# Patient Record
Sex: Male | Born: 1975 | Race: White | Hispanic: No | Marital: Married | Smoking: Former smoker
Health system: Southern US, Community
[De-identification: ages and names within clinical notes are randomized; demographics above are authoritative.]

## PROBLEM LIST (undated history)

## (undated) DIAGNOSIS — K219 Gastro-esophageal reflux disease without esophagitis: Secondary | ICD-10-CM

## (undated) HISTORY — PX: FUNCTIONAL ENDOSCOPIC SINUS SURGERY: SUR616

---

## 1999-02-02 ENCOUNTER — Emergency Department (HOSPITAL_COMMUNITY): Admission: EM | Admit: 1999-02-02 | Discharge: 1999-02-02 | Payer: Self-pay | Admitting: *Deleted

## 2006-08-01 ENCOUNTER — Ambulatory Visit: Payer: Self-pay | Admitting: Anesthesiology

## 2013-02-11 ENCOUNTER — Ambulatory Visit: Payer: Self-pay | Admitting: Medical

## 2015-01-04 ENCOUNTER — Encounter: Payer: Self-pay | Admitting: *Deleted

## 2015-01-05 ENCOUNTER — Encounter
Admission: RE | Disposition: A | Discharge status: Home Health | Payer: Self-pay | Source: Ambulatory Visit | Attending: Gastroenterology

## 2015-01-05 ENCOUNTER — Ambulatory Visit: Payer: 59 | Admitting: Anesthesiology

## 2015-01-05 ENCOUNTER — Encounter: Payer: Self-pay | Admitting: *Deleted

## 2015-01-05 ENCOUNTER — Ambulatory Visit
Admission: RE | Admit: 2015-01-05 | Discharge: 2015-01-05 | Disposition: A | Discharge status: Home Health | Payer: 59 | Source: Ambulatory Visit | Attending: Gastroenterology | Admitting: Gastroenterology

## 2015-01-05 DIAGNOSIS — Z833 Family history of diabetes mellitus: Secondary | ICD-10-CM | POA: Diagnosis not present

## 2015-01-05 DIAGNOSIS — Z8249 Family history of ischemic heart disease and other diseases of the circulatory system: Secondary | ICD-10-CM | POA: Diagnosis not present

## 2015-01-05 DIAGNOSIS — Z801 Family history of malignant neoplasm of trachea, bronchus and lung: Secondary | ICD-10-CM | POA: Insufficient documentation

## 2015-01-05 DIAGNOSIS — Z87891 Personal history of nicotine dependence: Secondary | ICD-10-CM | POA: Diagnosis not present

## 2015-01-05 DIAGNOSIS — K219 Gastro-esophageal reflux disease without esophagitis: Secondary | ICD-10-CM | POA: Insufficient documentation

## 2015-01-05 DIAGNOSIS — K625 Hemorrhage of anus and rectum: Secondary | ICD-10-CM | POA: Insufficient documentation

## 2015-01-05 DIAGNOSIS — Z8719 Personal history of other diseases of the digestive system: Secondary | ICD-10-CM | POA: Diagnosis present

## 2015-01-05 DIAGNOSIS — Z79899 Other long term (current) drug therapy: Secondary | ICD-10-CM | POA: Diagnosis not present

## 2015-01-05 DIAGNOSIS — Z9889 Other specified postprocedural states: Secondary | ICD-10-CM | POA: Insufficient documentation

## 2015-01-05 HISTORY — DX: Gastro-esophageal reflux disease without esophagitis: K21.9

## 2015-01-05 HISTORY — PX: COLONOSCOPY WITH PROPOFOL: SHX5780

## 2015-01-05 SURGERY — COLONOSCOPY WITH PROPOFOL
Anesthesia: General

## 2015-01-05 MED ORDER — SODIUM CHLORIDE 0.9 % IV SOLN: INTRAVENOUS | Status: DC

## 2015-01-05 MED ORDER — MIDAZOLAM HCL 2 MG/2ML IJ SOLN
INTRAMUSCULAR | Status: DC | PRN
  Administered 2015-01-05: 2 mg via INTRAVENOUS

## 2015-01-05 MED ORDER — SODIUM CHLORIDE 0.9 % IV SOLN
INTRAVENOUS | Status: DC
  Administered 2015-01-05: 11:00:00 via INTRAVENOUS

## 2015-01-05 MED ORDER — LIDOCAINE HCL (CARDIAC) 20 MG/ML IV SOLN
INTRAVENOUS | Status: DC | PRN
  Administered 2015-01-05: 60 mg via INTRAVENOUS

## 2015-01-05 MED ORDER — PROPOFOL 500 MG/50ML IV EMUL
INTRAVENOUS | Status: DC | PRN
  Administered 2015-01-05: 140 ug/kg/min via INTRAVENOUS

## 2015-01-05 MED ORDER — PROPOFOL 10 MG/ML IV BOLUS
INTRAVENOUS | Status: DC | PRN
  Administered 2015-01-05: 50 mg via INTRAVENOUS

## 2015-01-05 NOTE — H&P (Signed)
    Primary Care Physician:  Erasmo Downer, NP Primary Gastroenterologist:  Dr. Bluford Kaufmann  Pre-Procedure History & Physical: HPI:  Jonathan Arias is a 39 y.o. male is here for an colonoscopy.   Past Medical History  Diagnosis Date  . GERD (gastroesophageal reflux disease)     Past Surgical History  Procedure Laterality Date  . Functional endoscopic sinus surgery      Prior to Admission medications   Medication Sig Start Date End Date Taking? Authorizing Provider  omeprazole (PRILOSEC) 20 MG capsule Take 20 mg by mouth daily.   Yes Historical Provider, MD    Allergies as of 12/20/2014  . (Not on File)    History reviewed. No pertinent family history.  Social History   Social History  . Marital Status: Married    Spouse Name: N/A  . Number of Children: N/A  . Years of Education: N/A   Occupational History  . Not on file.   Social History Main Topics  . Smoking status: Former Games developer  . Smokeless tobacco: Never Used  . Alcohol Use: No  . Drug Use: No  . Sexual Activity: Not on file   Other Topics Concern  . Not on file   Social History Narrative    Review of Systems: See HPI, otherwise negative ROS  Physical Exam: BP 132/68 mmHg  Pulse 52  Temp(Src) 98.3 F (36.8 C) (Tympanic)  Resp 14  Ht  (1.88 m)  Wt 96.163 kg (212 lb)  BMI 27.21 kg/m2  SpO2 100% General:   Alert,  pleasant and cooperative in NAD Head:  Normocephalic and atraumatic. Neck:  Supple; no masses or thyromegaly. Lungs:  Clear throughout to auscultation.    Heart:  Regular rate and rhythm. Abdomen:  Soft, nontender and nondistended. Normal bowel sounds, without guarding, and without rebound.   Neurologic:  Alert and  oriented x4;  grossly normal neurologically.  Impression/Plan: Jonathan Arias is here for an colonoscopy to be performed for rectal bleeding.  Risks, benefits, limitations, and alternatives regarding colonoscopy have been reviewed with the patient.  Questions  have been answered.  All parties agreeable.   OH, Ezzard Standing, MD  01/05/2015, 10:32 AM

## 2015-01-05 NOTE — Transfer of Care (Signed)
Immediate Anesthesia Transfer of Care Note  Patient: Jonathan Arias  Procedure(s) Performed: Procedure(s): COLONOSCOPY WITH PROPOFOL (N/A)  Patient Location: Endoscopy Unit  Anesthesia Type:General  Level of Consciousness: awake, alert , oriented and patient cooperative  Airway & Oxygen Therapy: Patient Spontanous Breathing and Patient connected to nasal cannula oxygen  Post-op Assessment: Report given to RN, Post -op Vital signs reviewed and stable and Patient moving all extremities X 4  Post vital signs: Reviewed and stable  Last Vitals:  Filed Vitals:   01/05/15 1136  BP: 100/62  Pulse: 64  Temp: 35.8 C  Resp: 20    Complications: No apparent anesthesia complications

## 2015-01-05 NOTE — Op Note (Signed)
[facility] Patient Name: Jonathan Arias Procedure Date: 01/05/2015 11:16 AM MRN: [id number] Account #: [id number] Date of Birth: [birth year] Admit Type: Outpatient Age: 39 Room: [facility] ENDO ROOM 4 Gender: Male Note Status: Finalized Procedure:         Colonoscopy Indications:       Rectal bleeding Providers:         Ezzard Standing. Bluford Kaufmann, MD Referring MD:      Sallye Lat Md, MD (Referring MD) Medicines:         Monitored Anesthesia Care Complications:     No immediate complications. Procedure:         Pre-Anesthesia Assessment:                    - Prior to the procedure, a History and Physical was                     performed, and patient medications, allergies and                     sensitivities were reviewed. The patient's tolerance of                     previous anesthesia was reviewed.                    - The risks and benefits of the procedure and the sedation                     options and risks were discussed with the patient. All                     questions were answered and informed consent was obtained.                    - After reviewing the risks and benefits, the patient was                     deemed in satisfactory condition to undergo the procedure.                    After obtaining informed consent, the colonoscope was                     passed under direct vision. Throughout the procedure, the                     patient's blood pressure, pulse, and oxygen saturations                     were monitored continuously. The Olympus CF-Q160AL                     colonoscope (S#. [id number]) was introduced through the anus                     and advanced to the the cecum, identified by appendiceal                     orifice and ileocecal valve. The colonoscopy was performed                     without difficulty. The patient tolerated the procedure  well. The quality of the bowel preparation was good. Findings:  The colon (entire examined portion) appeared normal. Impression:        - The entire examined colon is normal.                    - No specimens collected. Recommendation:    - Discharge patient to home.                    - Repeat colonoscopy in 10 years for surveillance.                    - The findings and recommendations were discussed with the                     patient. Procedure Code(s): --- Professional ---                    [phone], Colonoscopy, flexible; diagnostic, including                     collection of specimen(s) by brushing or washing, when                     performed (separate procedure) Diagnosis Code(s): --- Professional ---                    K62.5, Hemorrhage of anus and rectum CPT copyright 2014 American Medical Association. All rights reserved. The codes documented in this report are preliminary and upon coder review may  be revised to meet current compliance requirements. Wallace Cullens, MD 01/05/2015 11:31:01 AM This report has been signed electronically. Number of Addenda: 0 Note Initiated On: 01/05/2015 11:16 AM Scope Withdrawal Time: 0 hours 5 minutes 2 seconds  Total Procedure Duration: 0 hours 7 minutes 46 seconds       [facility]

## 2015-01-05 NOTE — Anesthesia Preprocedure Evaluation (Signed)
Anesthesia Evaluation  Patient identified by MRN, date of birth, ID band Patient awake    Reviewed: Allergy & Precautions, H&P , NPO status , Patient's Chart, lab work & pertinent test results  History of Anesthesia Complications Negative for: history of anesthetic complications  Airway Mallampati: II  TM Distance: >3 FB Neck ROM: full    Dental no notable dental hx. (+) Poor Dentition, Missing, Upper Dentures   Pulmonary neg shortness of breath, COPD, former smoker,    Pulmonary exam normal breath sounds clear to auscultation       Cardiovascular Exercise Tolerance: Good (-) Past MI negative cardio ROS Normal cardiovascular exam Rhythm:regular Rate:Normal     Neuro/Psych negative neurological ROS  negative psych ROS   GI/Hepatic negative GI ROS, Neg liver ROS, GERD  Controlled,  Endo/Other  negative endocrine ROS  Renal/GU negative Renal ROS  negative genitourinary   Musculoskeletal   Abdominal   Peds  Hematology negative hematology ROS (+)   Anesthesia Other Findings Past Medical History:   GERD (gastroesophageal reflux disease)                       Reproductive/Obstetrics negative OB ROS                             Anesthesia Physical Anesthesia Plan  ASA: III  Anesthesia Plan: General   Post-op Pain Management:    Induction:   Airway Management Planned:   Additional Equipment:   Intra-op Plan:   Post-operative Plan:   Informed Consent: I have reviewed the patients History and Physical, chart, labs and discussed the procedure including the risks, benefits and alternatives for the proposed anesthesia with the patient or authorized representative who has indicated his/her understanding and acceptance.   Dental Advisory Given  Plan Discussed with: Anesthesiologist, CRNA and Surgeon  Anesthesia Plan Comments:         Anesthesia Quick Evaluation

## 2015-01-06 NOTE — Anesthesia Postprocedure Evaluation (Signed)
  Anesthesia Post-op Note  Patient: Jonathan Arias  Procedure(s) Performed: Procedure(s): COLONOSCOPY WITH PROPOFOL (N/A)  Anesthesia type:General  Patient location: PACU  Post pain: Pain level controlled  Post assessment: Post-op Vital signs reviewed, Patient's Cardiovascular Status Stable, Respiratory Function Stable, Patent Airway and No signs of Nausea or vomiting  Post vital signs: Reviewed and stable  Last Vitals:  Filed Vitals:   01/05/15 1207  BP: 108/80  Pulse: 52  Temp:   Resp: 18    Level of consciousness: awake, alert  and patient cooperative  Complications: No apparent anesthesia complications

## 2015-01-26 ENCOUNTER — Encounter: Payer: Self-pay | Admitting: Gastroenterology

## 2020-06-18 ENCOUNTER — Other Ambulatory Visit: Payer: Self-pay

## 2020-06-18 ENCOUNTER — Ambulatory Visit
Admission: EM | Admit: 2020-06-18 | Discharge: 2020-06-18 | Disposition: A | Discharge status: Home / Self Care | Payer: 59 | Attending: Physician Assistant | Admitting: Physician Assistant

## 2020-06-18 ENCOUNTER — Encounter: Payer: Self-pay | Admitting: Emergency Medicine

## 2020-06-18 ENCOUNTER — Ambulatory Visit (INDEPENDENT_AMBULATORY_CARE_PROVIDER_SITE_OTHER): Payer: 59

## 2020-06-18 DIAGNOSIS — S20212A Contusion of left front wall of thorax, initial encounter: Secondary | ICD-10-CM

## 2020-06-18 DIAGNOSIS — R0781 Pleurodynia: Secondary | ICD-10-CM

## 2020-06-18 DIAGNOSIS — W11XXXA Fall on and from ladder, initial encounter: Secondary | ICD-10-CM

## 2020-06-18 NOTE — ED Triage Notes (Signed)
Pt states he fell today off a ladder today about 3:30. He state she was on the first step and his feet got tangled and he landed on his right arm against his rib cage. He is having rib pain and left shoulder pain. He states the ribs are the worst. Declines shoulder xray.

## 2020-06-18 NOTE — Discharge Instructions (Signed)
RIB PAIN: Chest and rib x-rays do not show any fractures. Your ribs are likely bruised and/or sprained. Stressed avoiding painful activities . Reviewed RICE guidelines. Use medications as directed, including NSAIDs. If no NSAIDs have been prescribed for you today, you may take Aleve or Motrin over the counter. May use Tylenol in between doses of NSAIDs.  If no improvement in the next 1-2 weeks, f/u with PCP or return to our office for reexamination, and please feel free to call or return at any time for any questions or concerns you may have and we will be happy to help you!

## 2020-06-18 NOTE — ED Provider Notes (Signed)
[facility]    CSN: [id number] Arrival date & time: 06/18/20  1809      History   Chief Complaint Chief Complaint  Patient presents with  . Fall  . Rib Injury    HPI Jonathan Arias is a 45 y.o. male presenting for pain of the left lateral ribs following a fall from a ladder earlier today.  He says that his feet got twisted up and he fell down and his chest and ribs somehow landed on his own fist.  He says this happened about 5 hours ago.  He has pain whenever he breathes, laughs, touches the area or moves certain ways.  He denies any anterior chest pain or breathing difficulty.  Has not had any hemoptysis.  Denies any abdominal pain and does not notice any blood in the urine.  He says he also did hit his left shoulder but he can move it around just fine it is not really concerned about his shoulder.  He does not take anything for pain relief.  Has not iced the area.  He says he never hit his head or passed out.  He has no other complaints or concerns today.  HPI  Past Medical History:  Diagnosis Date  . GERD (gastroesophageal reflux disease)     There are no problems to display for this patient.   Past Surgical History:  Procedure Laterality Date  . COLONOSCOPY WITH PROPOFOL N/A 01/05/2015   Procedure: COLONOSCOPY WITH PROPOFOL;  Surgeon: Wallace Cullens, MD;  Location: [facility] ENDOSCOPY;  Service: Gastroenterology;  Laterality: N/A;  . FUNCTIONAL ENDOSCOPIC SINUS SURGERY         Home Medications    Prior to Admission medications   Medication Sig Start Date End Date Taking? Authorizing Provider  omeprazole (PRILOSEC) 20 MG capsule Take 20 mg by mouth daily.   Yes [provider]    Family History History reviewed. No pertinent family history.  Social History Social History   Tobacco Use  . Smoking status: Former Games developer  . Smokeless tobacco: Never Used  Vaping Use  . Vaping Use: Never used  Substance Use Topics  . Alcohol use: No  . Drug use: No      Allergies   Patient has no known allergies.   Review of Systems Review of Systems  Constitutional: Negative for fatigue.  Respiratory: Negative for cough and shortness of breath.   Cardiovascular: Negative for chest pain.  Gastrointestinal: Negative for abdominal pain.  Genitourinary: Negative for hematuria.  Musculoskeletal: Positive for arthralgias (rib pain and shoulder pain). Negative for gait problem, joint swelling and myalgias.  Skin: Negative for rash and wound.  Neurological: Negative for syncope, weakness, numbness and headaches.     Physical Exam Triage Vital Signs ED Triage Vitals  Enc Vitals Group     BP 06/18/20 1937 136/85     Pulse Rate 06/18/20 1937 69     Resp 06/18/20 1937 18     Temp 06/18/20 1937 98.1 F (36.7 C)     Temp Source 06/18/20 1937 Oral     SpO2 06/18/20 1937 100 %     Weight 06/18/20 1935 212 lb 1.3 oz (96.2 kg)     Height 06/18/20 1935 6\' 2"  (1.88 m)     Head Circumference --      Peak Flow --      Pain Score 06/18/20 1935 5     Pain Loc --      Pain Edu? --  Excl. in GC? --    No data found.  Updated Vital Signs BP 136/85 (BP Location: Right Arm)   Pulse 69   Temp 98.1 F (36.7 C) (Oral)   Resp 18   Ht 6\' 2"  (1.88 m)   Wt 212 lb 1.3 oz (96.2 kg)   SpO2 100%   BMI 27.23 kg/m       Physical Exam Vitals and nursing note reviewed.  Constitutional:      General: He is not in acute distress.    Appearance: Normal appearance. He is well-developed and normal weight. He is not ill-appearing.  HENT:     Head: Normocephalic and atraumatic.  Eyes:     General: No scleral icterus.    Conjunctiva/sclera: Conjunctivae normal.  Cardiovascular:     Rate and Rhythm: Normal rate and regular rhythm.     Heart sounds: Normal heart sounds.  Pulmonary:     Effort: Pulmonary effort is normal. No respiratory distress.     Breath sounds: Normal breath sounds. No wheezing, rhonchi or rales.  Chest:     Chest wall: Tenderness  (TTP lateral ribs 5-7. No bruising or swelling. No step offs) present.  Abdominal:     Palpations: Abdomen is soft.     Tenderness: There is no abdominal tenderness.  Musculoskeletal:        General: Tenderness (left shoulder) present. Normal range of motion.     Cervical back: Neck supple.  Skin:    General: Skin is warm and dry.  Neurological:     General: No focal deficit present.     Mental Status: He is alert. Mental status is at baseline.     Motor: No weakness.     Gait: Gait normal.  Psychiatric:        Mood and Affect: Mood normal.        Behavior: Behavior normal.        Thought Content: Thought content normal.      UC Treatments / Results  Labs (all labs ordered are listed, but only abnormal results are displayed) Labs Reviewed - No data to display  EKG   Radiology DG Ribs Unilateral W/Chest Left  Result Date: 06/18/2020 CLINICAL DATA:  Left-sided rib pain after falling off a ladder. EXAM: LEFT RIBS AND CHEST - 3+ VIEW COMPARISON:  None. FINDINGS: No fracture or other bone lesions are seen involving the ribs. There is no evidence of pneumothorax or pleural effusion. Both lungs are clear. Heart size and mediastinal contours are within normal limits. IMPRESSION: Negative. Electronically Signed   By: 06/20/2020 M.D.   On: 06/18/2020 20:02    Procedures Procedures (including critical care time)  Medications Ordered in UC Medications - No data to display  Initial Impression / Assessment and Plan / UC Course  I have reviewed the triage vital signs and the nursing notes.  Pertinent labs & imaging results that were available during my care of the patient were reviewed by me and considered in my medical decision making (see chart for details).   45 year old male presenting for left rib pain following a accidental fall from a ladder.  All of his vital signs are normal and stable.  He is overall well-appearing.  He does have tenderness throughout the left lateral  ribs 5 through 7.  No overlying bruising, swelling or step-offs.  He does have full range of motion of the shoulder and does not want that to be evaluated today.  Chest x-ray obtained today which  is within normal limits.  No evidence of fracture.  I did independently review the images myself.  Also there is no evidence of pneumothorax.  Reviewed results with patient.  Advised him that he likely has bruised and/or sprained ribs.  Advised conservative care at this time with NSAIDs and Tylenol for pain relief and local cryotherapy.  I did offer an anti-inflammatory, but he declined.  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  Advised him to follow-up for any red flag signs or symptoms including any worsening chest or rib pain, breathing difficulty, fever or cough.  Patient agreeable.   Final Clinical Impressions(s) / UC Diagnoses   Final diagnoses:  Rib contusion, left, initial encounter  Rib pain on left side     Discharge Instructions     RIB PAIN: Chest and rib x-rays do not show any fractures. Your ribs are likely bruised and/or sprained. Stressed avoiding painful activities . Reviewed RICE guidelines. Use medications as directed, including NSAIDs. If no NSAIDs have been prescribed for you today, you may take Aleve or Motrin over the counter. May use Tylenol in between doses of NSAIDs.  If no improvement in the next 1-2 weeks, f/u with PCP or return to our office for reexamination, and please feel free to call or return at any time for any questions or concerns you may have and we will be happy to help you!        ED Prescriptions    None     I have reviewed the PDMP during this encounter.   Shirlee Latch, PA-C 06/18/20 2019

## 2021-11-11 IMAGING — CR DG RIBS W/ CHEST 3+V*L*
5 series · 5 of 5 positions shown · non-contrast
Comparison: None.

CLINICAL DATA: Left-sided rib pain after falling off a ladder.

EXAM:
LEFT RIBS AND CHEST - 3+ VIEW

[chest pa]
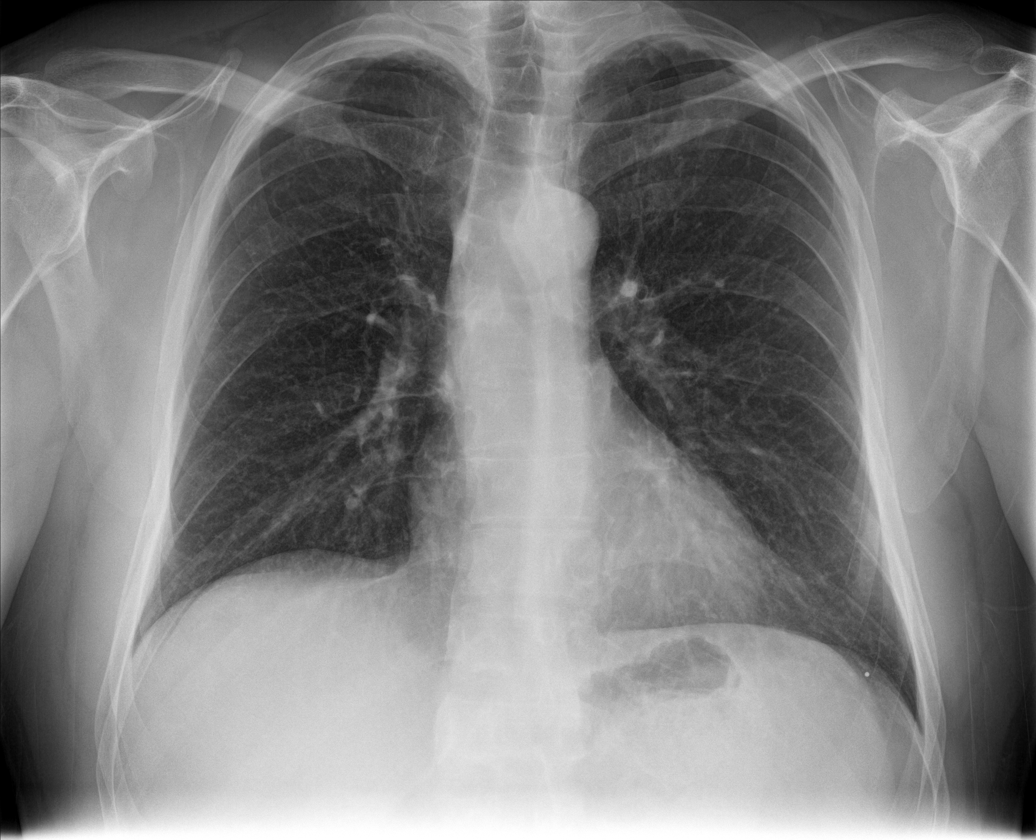

[rib pa (1 of 2)]
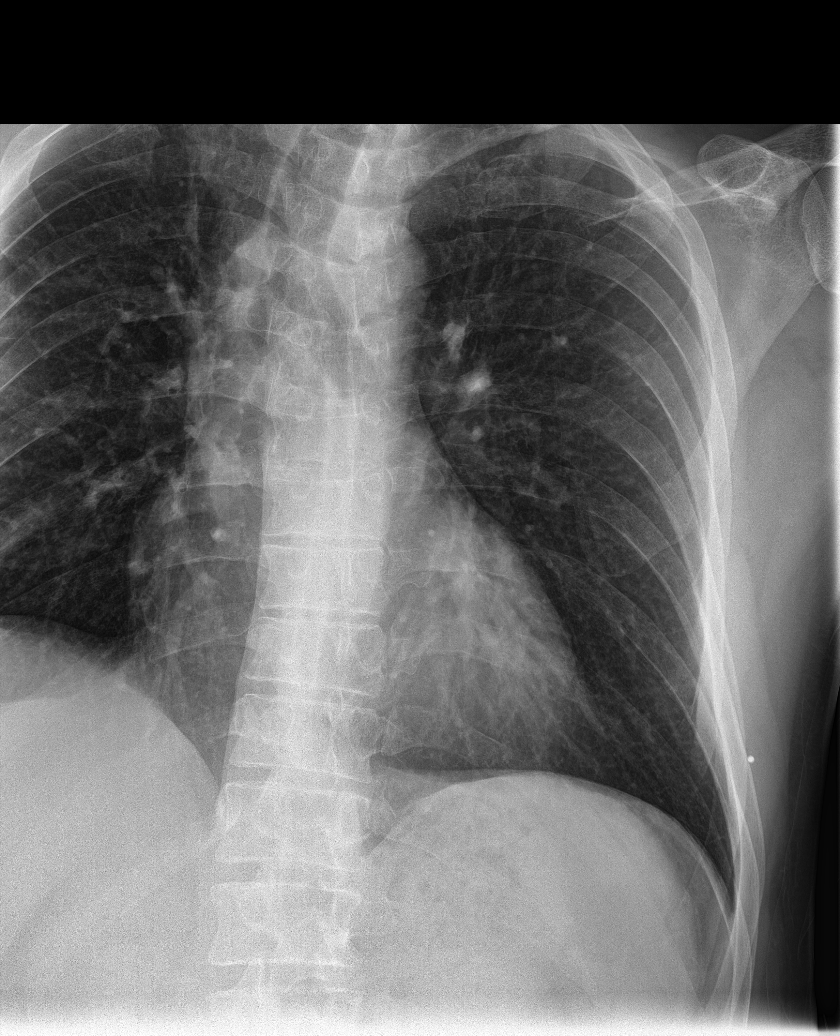

[rib pa (2 of 2)]
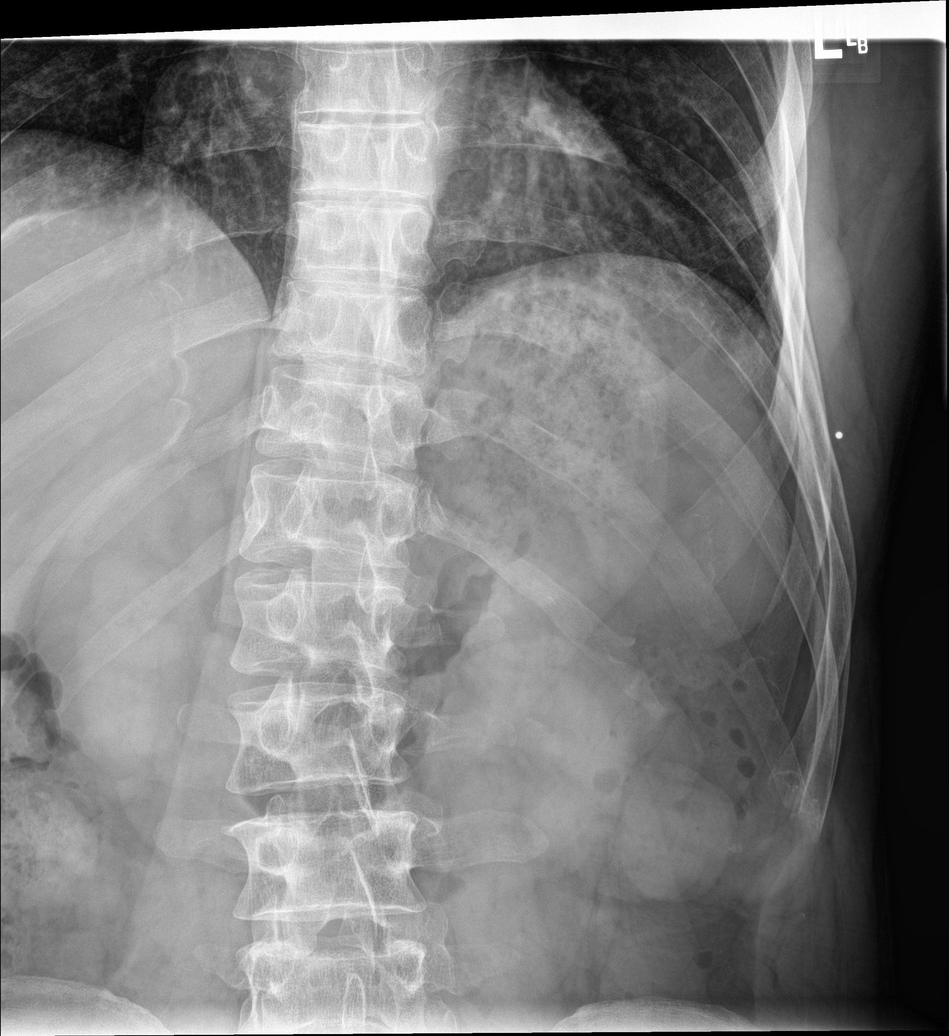

[rib obl (1 of 2)]
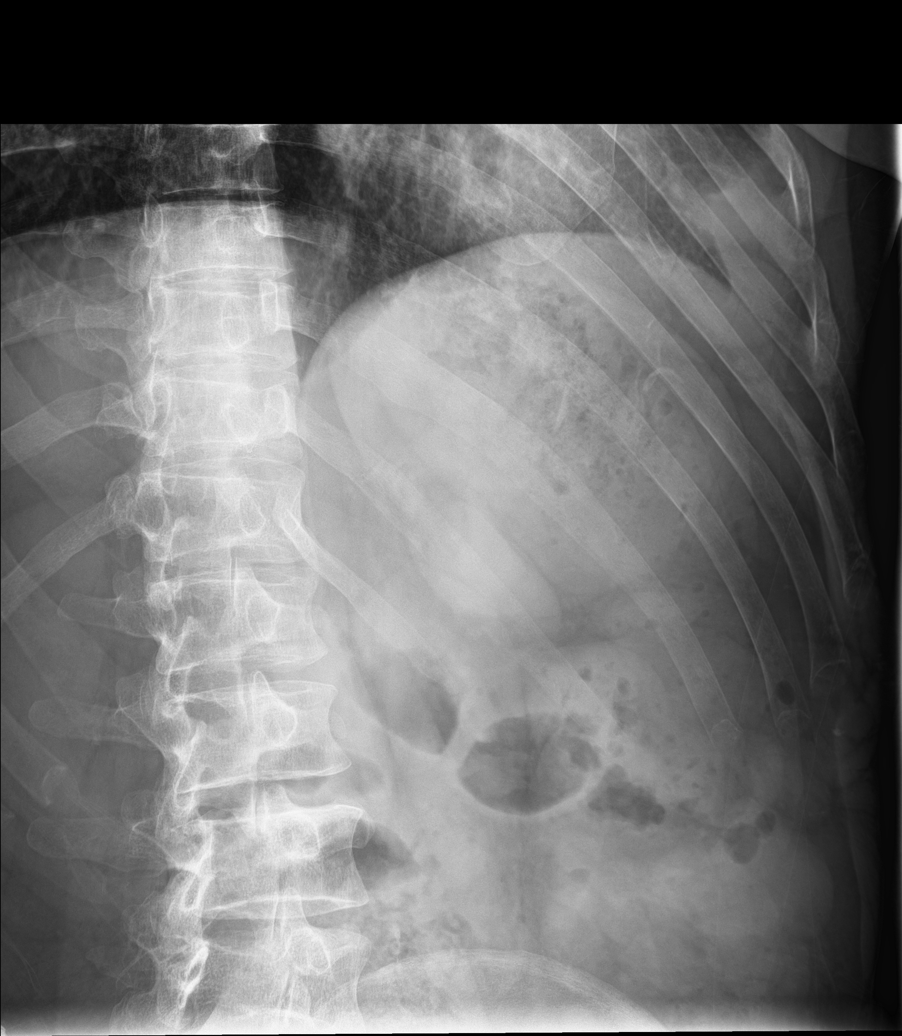

[rib obl (2 of 2)]
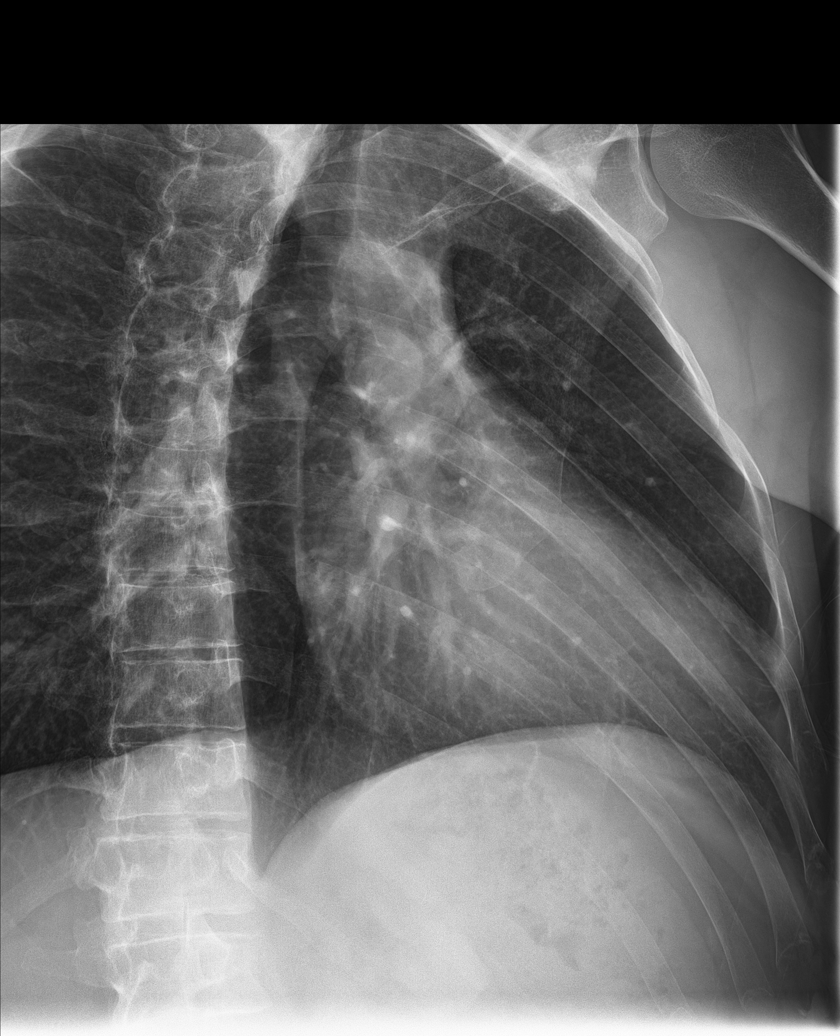

[5 of 5 positions shown; findings below may reference images not displayed]

FINDINGS: No fracture or other bone lesions are seen involving the ribs. There
is no evidence of pneumothorax or pleural effusion. Both lungs are
clear. Heart size and mediastinal contours are within normal limits.
IMPRESSION: Negative.
